# Patient Record
Sex: Male | Born: 1980 | Race: Black or African American | Hispanic: No | Marital: Single | Smoking: Current some day smoker
Health system: Southern US, Community
[De-identification: ages and names within clinical notes are randomized; demographics above are authoritative.]

## PROBLEM LIST (undated history)

## (undated) DIAGNOSIS — K219 Gastro-esophageal reflux disease without esophagitis: Secondary | ICD-10-CM

## (undated) DIAGNOSIS — J302 Other seasonal allergic rhinitis: Secondary | ICD-10-CM

---

## 2019-06-21 ENCOUNTER — Encounter: Payer: Self-pay | Admitting: Emergency Medicine

## 2019-06-21 ENCOUNTER — Emergency Department
Admission: EM | Admit: 2019-06-21 | Discharge: 2019-06-21 | Disposition: A | Discharge status: Home / Self Care | Payer: Self-pay | Source: Home / Self Care

## 2019-06-21 ENCOUNTER — Other Ambulatory Visit: Payer: Self-pay

## 2019-06-21 DIAGNOSIS — J208 Acute bronchitis due to other specified organisms: Secondary | ICD-10-CM

## 2019-06-21 DIAGNOSIS — B9689 Other specified bacterial agents as the cause of diseases classified elsewhere: Secondary | ICD-10-CM

## 2019-06-21 DIAGNOSIS — R053 Chronic cough: Secondary | ICD-10-CM

## 2019-06-21 DIAGNOSIS — R062 Wheezing: Secondary | ICD-10-CM

## 2019-06-21 HISTORY — DX: Other seasonal allergic rhinitis: J30.2

## 2019-06-21 HISTORY — DX: Gastro-esophageal reflux disease without esophagitis: K21.9

## 2019-06-21 MED ORDER — CEFDINIR 300 MG PO CAPS
300.0000 mg | ORAL_CAPSULE | Freq: Two times a day (BID) | ORAL | 0 refills | Status: AC
Start: 2019-06-21 — End: 2019-07-01

## 2019-06-21 MED ORDER — ALBUTEROL SULFATE HFA 108 (90 BASE) MCG/ACT IN AERS: 1.0000 | INHALATION_SPRAY | Freq: Four times a day (QID) | RESPIRATORY_TRACT | 0 refills | Status: AC | PRN

## 2019-06-21 MED ORDER — AZITHROMYCIN 250 MG PO TABS: 250.0000 mg | ORAL_TABLET | Freq: Every day | ORAL | 0 refills | Status: DC

## 2019-06-21 MED ORDER — PREDNISONE 50 MG PO TABS: 50.0000 mg | ORAL_TABLET | Freq: Every day | ORAL | 0 refills | Status: AC

## 2019-06-21 NOTE — ED Provider Notes (Signed)
[facility]    CSN: [id number] Arrival date & time: 06/21/19  1007      History   Chief Complaint Chief Complaint  Patient presents with  . Cough    HPI Richard Golden is a 39 y.o. male.   HPI Richard Golden is a 39 y.o. male presenting to UC with c/o 1 month of worsening productive cough with white sputum and chest tightness. Cough is worse at night. Denies fever, chills, n/v/d. He has had pneumonia in the past but did not need to be hospitalized. Pt states he does well with "a zpak, steroids and inhaler" denies sick contacts or recent travel. He has not received his Covid-19 vaccine.  He does not have a PCP.   Past Medical History:  Diagnosis Date  . GERD (gastroesophageal reflux disease)   . Seasonal allergies     There are no problems to display for this patient.   History reviewed. No pertinent surgical history.     Home Medications    Prior to Admission medications   Medication Sig Start Date End Date Taking? Authorizing Provider  loratadine (CLARITIN) 10 MG tablet Take 10 mg by mouth daily.   Yes [provider]  omeprazole (PRILOSEC) 40 MG capsule Take 40 mg by mouth daily.   Yes [provider]  albuterol (VENTOLIN HFA) 108 (90 Base) MCG/ACT inhaler Inhale 1-2 puffs into the lungs every 6 (six) hours as needed for wheezing or shortness of breath. 06/21/19   Noe Gens, PA-C  azithromycin (ZITHROMAX) 250 MG tablet Take 1 tablet (250 mg total) by mouth daily. Take first 2 tablets together, then 1 every day until finished. 06/21/19   Noe Gens, PA-C  cefdinir (OMNICEF) 300 MG capsule Take 1 capsule (300 mg total) by mouth 2 (two) times daily for 10 days. 06/21/19 07/01/19  Noe Gens, PA-C  predniSONE (DELTASONE) 50 MG tablet Take 1 tablet (50 mg total) by mouth daily with breakfast for 5 days. 06/21/19 06/26/19  Noe Gens, PA-C    Family History Family History  Problem Relation Age of Onset  . GER disease Mother   .  Obesity Father     Social History Social History   Tobacco Use  . Smoking status: Current Some Day Smoker    Types: Cigars  . Smokeless tobacco: Never Used  Substance Use Topics  . Alcohol use: Yes  . Drug use: Not Currently     Allergies   Patient has no known allergies.   Review of Systems Review of Systems  Constitutional: Negative for chills and fever.  HENT: Positive for congestion. Negative for ear pain, sore throat, trouble swallowing and voice change.   Respiratory: Positive for cough and chest tightness. Negative for shortness of breath.   Cardiovascular: Negative for chest pain and palpitations.  Gastrointestinal: Negative for abdominal pain, diarrhea, nausea and vomiting.  Musculoskeletal: Negative for arthralgias, back pain and myalgias.  Skin: Negative for rash.  Neurological: Positive for headaches. Negative for dizziness and light-headedness.  All other systems reviewed and are negative.    Physical Exam Triage Vital Signs ED Triage Vitals  Enc Vitals Group     BP 06/21/19 1026 (!) 143/89     Pulse Rate 06/21/19 1026 93     Resp 06/21/19 1026 18     Temp 06/21/19 1026 98.1 F (36.7 C)     Temp Source 06/21/19 1026 Oral     SpO2 06/21/19 1026 96 %  Weight 06/21/19 1027 195 lb (88.5 kg)     Height 06/21/19 1027 5\' 6"  (1.676 m)     Head Circumference --      Peak Flow --      Pain Score 06/21/19 1026 0     Pain Loc --      Pain Edu? --      Excl. in GC? --    No data found.  Updated Vital Signs BP (!) 143/89 (BP Location: Right Arm)   Pulse 93   Temp 98.1 F (36.7 C) (Oral)   Resp 18   Ht 5\' 6"  (1.676 m)   Wt 195 lb (88.5 kg)   SpO2 96%   BMI 31.47 kg/m   Visual Acuity Right Eye Distance:   Left Eye Distance:   Bilateral Distance:    Right Eye Near:   Left Eye Near:    Bilateral Near:     Physical Exam Vitals and nursing note reviewed.  Constitutional:      General: He is not in acute distress.    Appearance: Normal  appearance. He is well-developed. He is not ill-appearing, toxic-appearing or diaphoretic.  HENT:     Head: Normocephalic and atraumatic.     Right Ear: Tympanic membrane and ear canal normal.     Left Ear: Tympanic membrane and ear canal normal.     Nose: Nose normal.     Mouth/Throat:     Mouth: Mucous membranes are moist.     Pharynx: Oropharynx is clear.  Cardiovascular:     Rate and Rhythm: Normal rate and regular rhythm.  Pulmonary:     Effort: Pulmonary effort is normal. No respiratory distress.     Breath sounds: Wheezing and rhonchi present.     Comments: Diffuse wheeze and rhonchi. Able to speak in full sentences. Occasional mildly productive cough during exam.  Musculoskeletal:        General: Normal range of motion.     Cervical back: Normal range of motion.  Skin:    General: Skin is warm and dry.  Neurological:     Mental Status: He is alert and oriented to person, place, and time.  Psychiatric:        Behavior: Behavior normal.      UC Treatments / Results  Labs (all labs ordered are listed, but only abnormal results are displayed) Labs Reviewed - No data to display  EKG   Radiology No results found.  Procedures Procedures (including critical care time)  Medications Ordered in UC Medications - No data to display  Initial Impression / Assessment and Plan / UC Course  I have reviewed the triage vital signs and the nursing notes.  Pertinent labs & imaging results that were available during my care of the patient were reviewed by me and considered in my medical decision making (see chart for details).     Suspected bacterial bronchitis given duration of symptoms Discussed CXR, pt declined due to financial concerns.  Pt also declined Covid testing.  Will try trial of antibiotics, prednisone and albuterol inhaler  Encouraged f/u If not improving. AVS provided   Final Clinical Impressions(s) / UC Diagnoses   Final diagnoses:  Acute bacterial  bronchitis  Wheeze  Chronic cough     Discharge Instructions      Please take antibiotics as prescribed and be sure to complete entire course even if you start to feel better to ensure infection does not come back.  You may take 500mg  acetaminophen every  4-6 hours or in combination with ibuprofen 400-600mg  every 6-8 hours as needed for pain, inflammation, and fever.  Be sure to well hydrated with clear liquids and get at least 8 hours of sleep at night, preferably more while sick.   Please follow up with family medicine in 1 week if needed.     ED Prescriptions    Medication Sig Dispense Auth. Provider   cefdinir (OMNICEF) 300 MG capsule Take 1 capsule (300 mg total) by mouth 2 (two) times daily for 10 days. 20 capsule Doroteo Glassman, Clee Pandit O, PA-C   azithromycin (ZITHROMAX) 250 MG tablet Take 1 tablet (250 mg total) by mouth daily. Take first 2 tablets together, then 1 every day until finished. 6 tablet Doroteo Glassman, Nhia Heaphy O, PA-C   predniSONE (DELTASONE) 50 MG tablet Take 1 tablet (50 mg total) by mouth daily with breakfast for 5 days. 5 tablet Doroteo Glassman, Brylynn Hanssen O, PA-C   albuterol (VENTOLIN HFA) 108 (90 Base) MCG/ACT inhaler Inhale 1-2 puffs into the lungs every 6 (six) hours as needed for wheezing or shortness of breath. 8 g Lurene Shadow, PA-C     PDMP not reviewed this encounter.   Lurene Shadow, [place] 06/21/19 1129

## 2019-06-21 NOTE — ED Triage Notes (Signed)
Productive cough x 1 month, white mucus

## 2019-06-21 NOTE — Discharge Instructions (Signed)

## 2019-08-03 ENCOUNTER — Encounter: Payer: Self-pay | Admitting: Emergency Medicine

## 2019-08-03 ENCOUNTER — Other Ambulatory Visit: Payer: Self-pay

## 2019-08-03 ENCOUNTER — Emergency Department
Admission: EM | Admit: 2019-08-03 | Discharge: 2019-08-03 | Disposition: A | Discharge status: Home / Self Care | Payer: Self-pay | Source: Home / Self Care

## 2019-08-03 ENCOUNTER — Emergency Department (INDEPENDENT_AMBULATORY_CARE_PROVIDER_SITE_OTHER): Payer: Self-pay

## 2019-08-03 DIAGNOSIS — R05 Cough: Secondary | ICD-10-CM

## 2019-08-03 DIAGNOSIS — R059 Cough, unspecified: Secondary | ICD-10-CM | POA: Diagnosis present

## 2019-08-03 MED ORDER — DULERA 100-5 MCG/ACT IN AERO: 2.0000 | INHALATION_SPRAY | Freq: Two times a day (BID) | RESPIRATORY_TRACT | 1 refills | Status: AC

## 2019-08-03 NOTE — ED Provider Notes (Signed)
Has had [facility]    CSN: [id number] Arrival date & time: 08/03/19  1144      History   Chief Complaint Chief Complaint  Patient presents with  . Cough    HPI Richard Golden is a 39 y.o. male.   Patient has been coughing for 2-1/2 months.  Was treated here on June 6 with several antibiotics but cough has persisted some shortness of breath with exertion.  Nighttime he coughs up some sputum that looks foamy.  He denies any dependent edema.  Denies any chest pain or heart problems.  He does have a history of asthma.  HPI  Past Medical History:  Diagnosis Date  . GERD (gastroesophageal reflux disease)   . Seasonal allergies     There are no problems to display for this patient.   History reviewed. No pertinent surgical history.     Home Medications    Prior to Admission medications   Medication Sig Start Date End Date Taking? Authorizing Provider  albuterol (VENTOLIN HFA) 108 (90 Base) MCG/ACT inhaler Inhale 1-2 puffs into the lungs every 6 (six) hours as needed for wheezing or shortness of breath. 06/21/19   Lurene Shadow, PA-C  loratadine (CLARITIN) 10 MG tablet Take 10 mg by mouth daily.    [provider]  omeprazole (PRILOSEC) 40 MG capsule Take 40 mg by mouth daily.    [provider]    Family History Family History  Problem Relation Age of Onset  . GER disease Mother   . Obesity Father     Social History Social History   Tobacco Use  . Smoking status: Current Some Day Smoker    Types: Cigars  . Smokeless tobacco: Never Used  Vaping Use  . Vaping Use: Never used  Substance Use Topics  . Alcohol use: Yes  . Drug use: Not Currently     Allergies   Patient has no known allergies.   Review of Systems Review of Systems  Constitutional: Positive for fatigue.  Respiratory: Positive for cough and shortness of breath.      Physical Exam Triage Vital Signs ED Triage Vitals  Enc Vitals Group     BP 08/03/19 1219  (!) 158/99     Pulse Rate 08/03/19 1219 (!) 107     Resp 08/03/19 1221 18     Temp 08/03/19 1219 98.1 F (36.7 C)     Temp Source 08/03/19 1219 Oral     SpO2 08/03/19 1219 97 %     Weight 08/03/19 1220 205 lb (93 kg)     Height 08/03/19 1220 5\' 6"  (1.676 m)     Head Circumference --      Peak Flow --      Pain Score 08/03/19 1220 0     Pain Loc --      Pain Edu? --      Excl. in GC? --    No data found.  Updated Vital Signs BP (!) 158/99 (BP Location: Left Arm)   Pulse (!) 107   Temp 98.1 F (36.7 C) (Oral)   Resp 18   Ht 5\' 6"  (1.676 m)   Wt 93 kg   SpO2 97%   BMI 33.09 kg/m   Visual Acuity Right Eye Distance:   Left Eye Distance:   Bilateral Distance:    Right Eye Near:   Left Eye Near:    Bilateral Near:     Physical Exam Vitals and nursing note reviewed.  Constitutional:  Appearance: Normal appearance.  HENT:     Head: Normocephalic.     Right Ear: Tympanic membrane normal.     Left Ear: Tympanic membrane normal.     Nose: Nose normal.     Mouth/Throat:     Mouth: Mucous membranes are moist.  Cardiovascular:     Rate and Rhythm: Normal rate and regular rhythm.  Pulmonary:     Breath sounds: Wheezing and rhonchi present.  Neurological:     General: No focal deficit present.     Mental Status: He is alert and oriented to person, place, and time.      UC Treatments / Results  Labs (all labs ordered are listed, but only abnormal results are displayed) Labs Reviewed - No data to display  EKG   Radiology No results found.  Procedures Procedures (including critical care time)  Medications Ordered in UC Medications - No data to display  Initial Impression / Assessment and Plan / UC Course  I have reviewed the triage vital signs and the nursing notes.  Pertinent labs & imaging results that were available during my care of the patient were reviewed by me and considered in my medical decision making (see chart for details).     Cough  for 2-1/2 months.  Took 2 different antibiotics initially without relief.  Symptoms seem to be worse at night and he coughs up some white foamy phlegm.  He does endorse some reflux symptoms as well as history of asthma.  I think his cough might be related to reflux and asthma and we will treat him with inhaler with steroids as well as Prilosec Final Clinical Impressions(s) / UC Diagnoses   Final diagnoses:  None   Discharge Instructions   None    ED Prescriptions    None     PDMP not reviewed this encounter.   Frederica Kuster, MD 08/03/19 1321

## 2019-08-03 NOTE — ED Triage Notes (Signed)
Productive cough x 2.5 months was treated here on June 6, finished all the meds, but still coughing

## 2019-08-03 NOTE — Discharge Instructions (Addendum)
Use Prilosec daily

## 2021-12-30 IMAGING — DX DG CHEST 2V
2 series · 2 of 2 positions shown · non-contrast
Comparison: None.

CLINICAL DATA: Cough for 2 months.

EXAM:
CHEST - 2 VIEW

[chest pa]
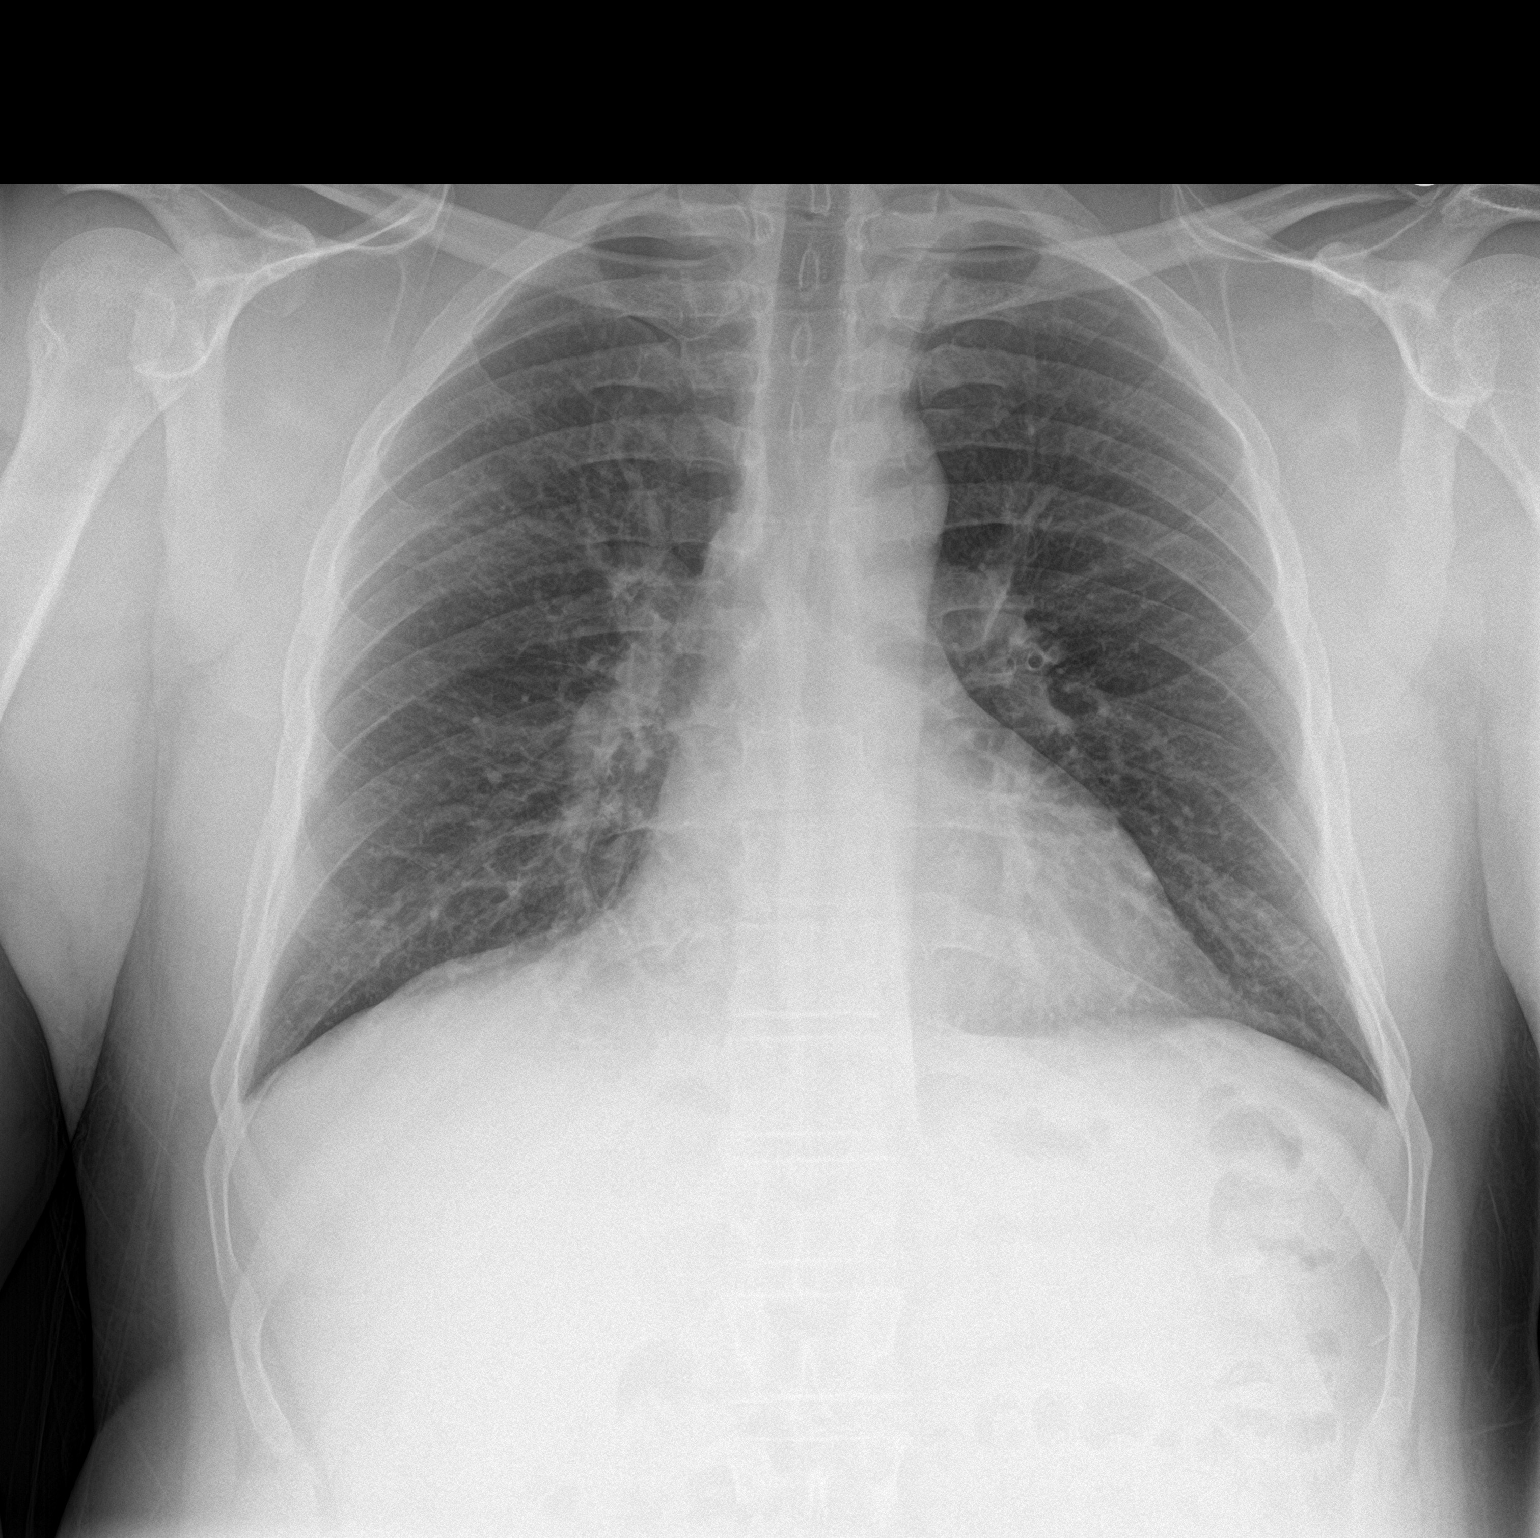

[chest lat]
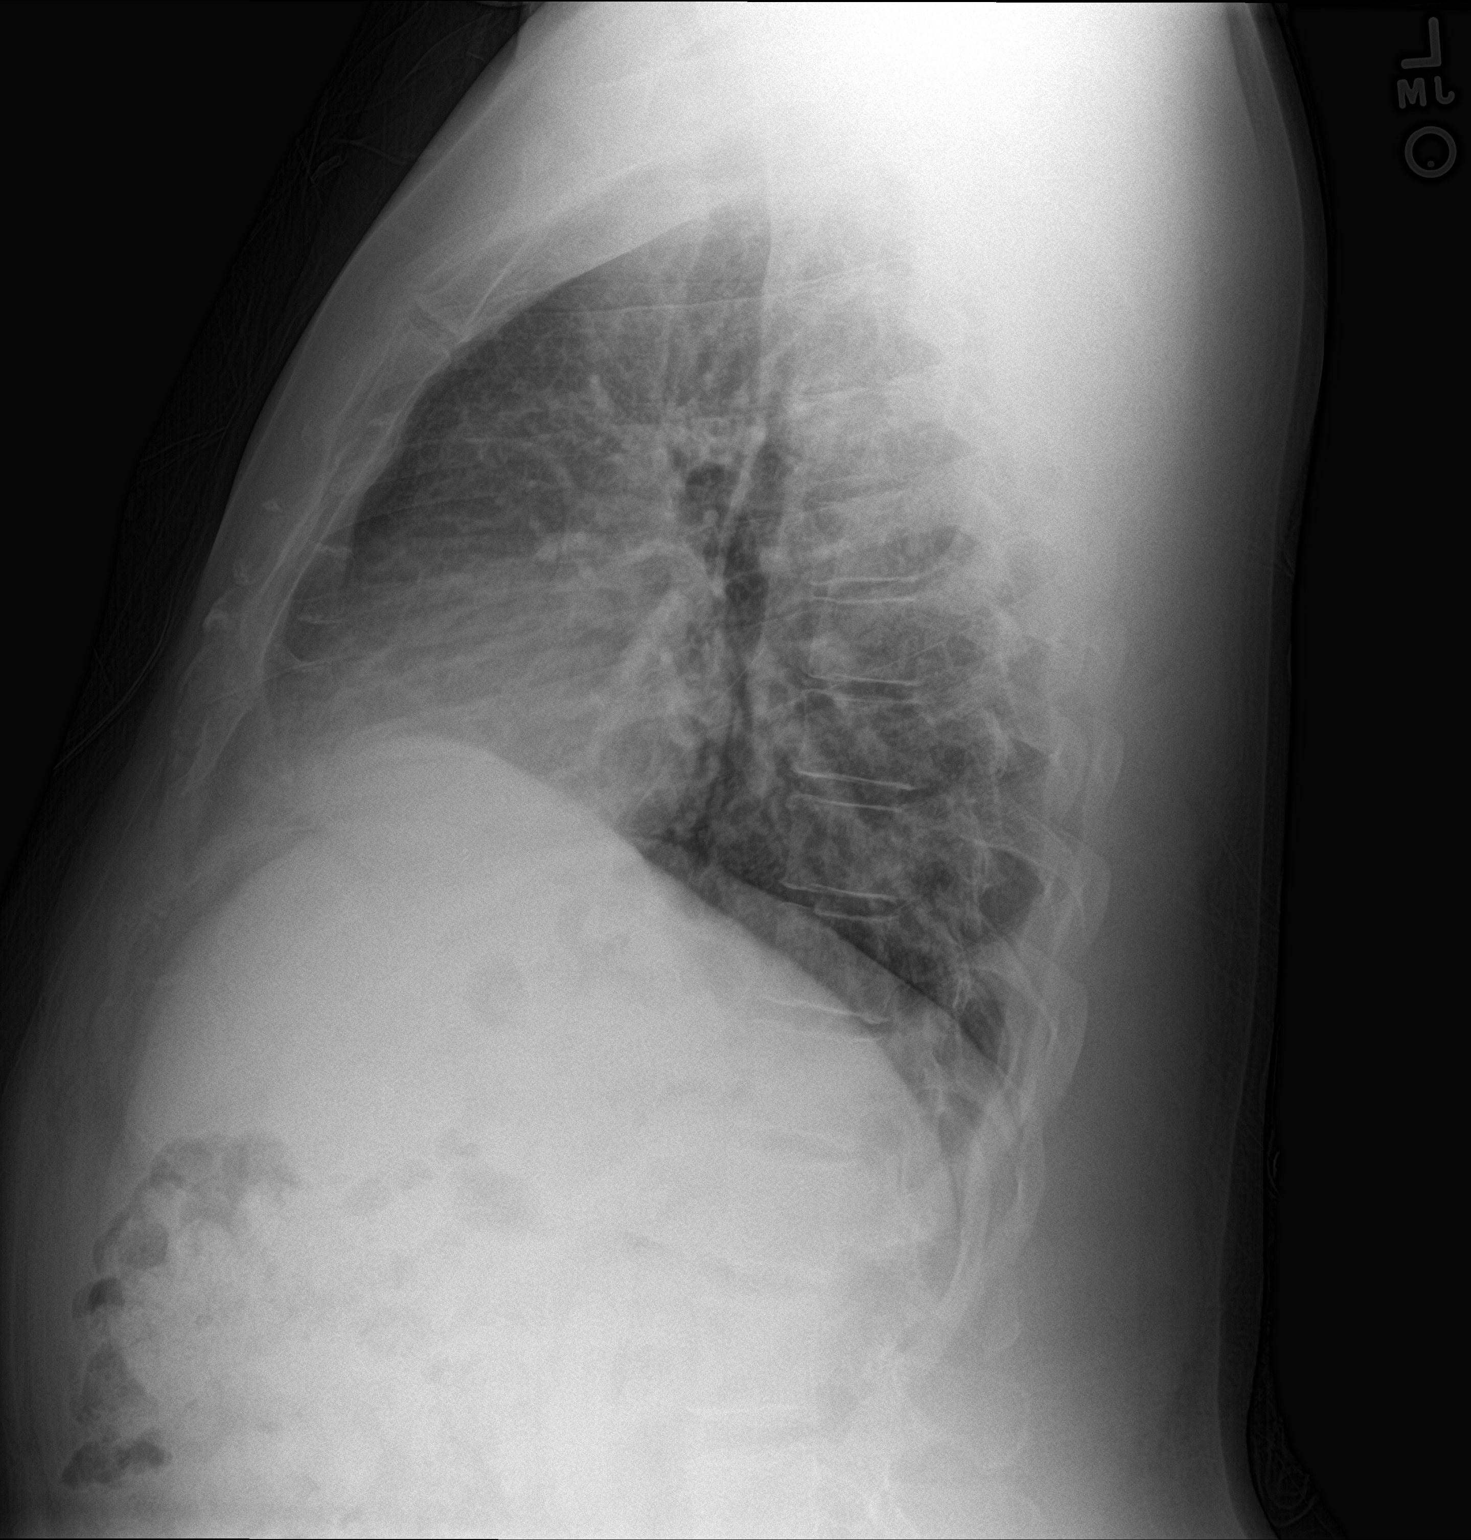

[2 of 2 positions shown; findings below may reference images not displayed]

FINDINGS: The heart size and mediastinal contours are within normal limits.
Both lungs are clear. The visualized skeletal structures are
unremarkable.
IMPRESSION: No active cardiopulmonary disease.
